# Patient Record
Sex: Female | Born: 1965 | Race: White | Hispanic: No | Marital: Married | Smoking: Never smoker
Health system: Southern US, Community
[De-identification: ages and names within clinical notes are randomized; demographics above are authoritative.]

## PROBLEM LIST (undated history)

## (undated) DIAGNOSIS — E785 Hyperlipidemia, unspecified: Secondary | ICD-10-CM

## (undated) DIAGNOSIS — I10 Essential (primary) hypertension: Secondary | ICD-10-CM

---

## 2003-10-14 ENCOUNTER — Observation Stay (HOSPITAL_COMMUNITY): Admission: EM | Admit: 2003-10-14 | Discharge: 2003-10-15 | Payer: Self-pay | Admitting: Emergency Medicine

## 2009-08-07 ENCOUNTER — Emergency Department (HOSPITAL_COMMUNITY): Admission: EM | Admit: 2009-08-07 | Discharge: 2009-08-08 | Payer: Self-pay | Admitting: Emergency Medicine

## 2010-05-01 LAB — POCT I-STAT, CHEM 8
Creatinine, Ser: 0.8 mg/dL (ref 0.4–1.2)
Sodium: 136 mEq/L (ref 135–145)

## 2010-07-01 NOTE — Discharge Summary (Signed)
NAME:  Monique Travis, Monique Travis                        ACCOUNT NO.:  [id number]   MEDICAL RECORD NO.:  [id number]                   PATIENT TYPE:  INP   LOCATION:  A222                                 FACILITY:  [facility]   PHYSICIAN:  Vania Rea, M.D.              DATE OF BIRTH:  02/14/1965   DATE OF ADMISSION:  10/14/2003  DATE OF DISCHARGE:  10/15/2003                                 DISCHARGE SUMMARY   PRIMARY CARE PHYSICIAN:  Unassigned.   DISCHARGE DIAGNOSES:  1.  Newly discovered hypertension.  2.  Headache, resolved.   DISPOSITION:  Discharged to home.   CONDITION ON DISCHARGE:  Stable.   DISCHARGE MEDICATIONS:  1.  HCTZ 12.5 mg daily.  2.  Lopressor 50 mg twice daily.  3.  Lisinopril 40 mg daily.   HOSPITAL COURSE:  Please refer to yesterday's admission History and  Physical.  This is a 45 year old Caucasian lady with no regular medical  followup who presented with three months of progressive fatigue and three  days of worsening headache.  The patient was found to have stage III  hypertension when evaluated. CT scan of the head was negative.  Chest x-ray  was negative.  Renal ultrasound was negative.  Urinalysis had no protein.  Her serum chemistry and CBC were essentially normal as described in the  admission History and Physical, and repeat labs today remain normal.  The  patient was started on a regimen of beta blockers, diuretic, and ACE  inhibitors, and this morning her pressure remained consistently normal.  For  the past 12 hours, systolics have ranged between 105 and 130, and her  diastolics remained between 68 and 80.   The patient's physical exam remains benign.  The patient had a consultation  with a nutritionist about an appropriate diet.  The patient has had a 2-D  echocardiogram, results of which are pending, and a fasting lipid panel has  been drawn, the results of which are pending.  The patient is trying to  secure a primary care physician.  When she  secures the physician, results of  her studies will be available.  In the meantime, the patient is being  discharged with a one-month supply of medication and advised to follow up  with a primary care physician of her choice within one week.     ___________________________________________                                         Vania Rea, M.D.   LC/MEDQ  D:  10/15/2003  T:  10/15/2003  Job:  [id number]

## 2010-07-01 NOTE — Procedures (Signed)
NAME:  Monique Travis, Monique Travis                        ACCOUNT NO.:  [id number]   MEDICAL RECORD NO.:  [id number]                   PATIENT TYPE:  INP   LOCATION:  A222                                 FACILITY:  [facility]   PHYSICIAN:  Vida Roller, M.D.                DATE OF BIRTH:  01-17-1966   DATE OF PROCEDURE:  DATE OF DISCHARGE:  10/15/2003                                  ECHOCARDIOGRAM   PRIMARY CARE PHYSICIAN:  Dr. Orvan Falconer.   TAPE NUMBER:  [id number].   TAPE COUNT:  [id number] through 3755.   INDICATIONS FOR PROCEDURE:  A 45 year old woman with hypertension.  The  technical quality of this study is poor.   MO tracings:  The aorta is 29 mm.   Left atrium is 35 mm.   Septum is 11 mm.   Posterior wall is 11 mm.   Left ventricular diastolic dimension is 48 mm.   Left ventricular systolic dimension is 33 mm.   2-D and Doppler imaging:  The left ventricle is normal size with normal  systolic function.  There is mild concentric left ventricular hypertrophy.  There are no obvious wall motion abnormalities.  Ejection fraction estimated  at 55 to 60%.   Right ventricle is normal size and normal systolic function.   Both atria appear to be normal size.   The aortic valve is not well seen, but there is no significant stenosis or  regurgitation.   The mitral valve is not well seen.  There appeared to be trace to mild  regurgitation.   The tricuspid valve has mild regurgitation.   Inferior vena cava appears to be normal size.   There is no pericardial effusion.   Ascending aorta not well seen.      ___________________________________________                                            Vida Roller, M.D.   JH/MEDQ  D:  10/15/2003  T:  10/16/2003  Job:  [id number]

## 2010-07-01 NOTE — H&P (Signed)
NAME:  Monique Travis, Monique Travis                        ACCOUNT NO.:  [id number]   MEDICAL RECORD NO.:  [id number]                   PATIENT TYPE:  EMS   LOCATION:  ED                                   FACILITY:  [facility]   PHYSICIAN:  Vania Rea, M.D.              DATE OF BIRTH:  [birth year]   DATE OF ADMISSION:  10/14/2003  DATE OF DISCHARGE:                                HISTORY & PHYSICAL   PRIMARY CARE PHYSICIAN:  Unassigned.   CHIEF COMPLAINT:  Headaches worse for the past 2 days.   HISTORY OF PRESENT ILLNESS:  This is a 45 year old Caucasian lady, who moved  from [place] about 2 years ago but has had no medical follow up for the  past 3 years.  Physician in [place] used to be Dr. Carita Pian.  For  the past 3 months, the patient has been feeling fatigued, not quite herself,  having headaches on and off and episodic dizziness.  For the past 2 days,  she has been having severe headaches associated with dizziness.  She came to  the emergency room where her blood pressure was found to be 185/113.   The patient denies any chest pain, shortness of breath, or palpitations.  She denies any orthopnea or PND.  She denies any lower extremity edema.  She  was nauseous yesterday but has been having no vomiting.   Her last menstrual period was last week and was normal.  She uses  contraceptive for intercourse.  She has not had a tubal ligation.  She has  been having no urinary frequency or decreased production of urine.   PAST MEDICAL HISTORY:  No significant.   PAST SURGICAL HISTORY:  Arthroscopic surgery of the right knee in 1984,  status post tonsillectomy as a child.   MEDICATIONS:  None.   ALLERGIES:  None.   SOCIAL HISTORY:  She has been married for the past 15 years.  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  She denies tobacco, alcohol, or  illicit drug use.   FAMILY HISTORY:  Significant for a mother who died at age 18 as a result of  complications of a hip  replacement surgery, apparently developed lower  extremity DVT and subsequent embolus, coded and then during the code,  endotracheal tube was placed in the esophagus.  No history of hypertension  in the mother.  Her father died at age 60 from cirrhosis of the liver.  He  was a heavy drinker.  She has one brother, age 61, in good health.  She is  the only member of her family who has now been found in hypertension.  She  does not have a past history of hypertension.   REVIEW OF SYSTEMS:  Headache as noted above.  She denies any syncope.  She  denies sinusitis, throat, or thyroid problems.  She denies chest pains or  shortness  of breath.  She denies any cardiac symptoms.  She denies vomiting,  diarrhea, or constipation.  She denies any urinary symptoms.  She denies any  problems related to her joints.  She denies anxiety or depression.  She  denies insomnia.  She denies any focal weakness.   PHYSICAL EXAMINATION:  GENERAL:  This is a pleasant Caucasian lady lying  flat in bed in no distress at this time.  VITAL SIGNS:  Initial temperature is 99.5.  Her current blood pressures are  160/100.  She has had 20 mg of Labetalol intravenously.  HEENT:  Her pupils are round, equal, and reactive to light.  She has pink  and anicteric.  Mucous membranes are moist.  She has all her teeth.  NECK:  There is no lymphadenopathy and no jugular venous distension.  CHEST:  Clear to auscultation bilaterally.  CARDIOVASCULAR:  She has a regular rhythm.  She has a 1-2/6 ejection  systolic murmur at the left upper sternal border.  ABDOMEN:  Obese, soft, and nontender.  There is no organomegaly.  EXTREMITIES:  She has 2+ pulses bilaterally, and she has a trace of edema  bilaterally.  NEUROLOGIC:  Her cranial nerves are grossly intact, and she has no focal  neurologic deficit.   LABORATORY DATA:  Her CBC is essentially normal with a white count of 8.8  and hematocrit of 39.9; however, MCV is low at 75.6.  Her  platelets are 343.  She seems to be of [place] ancestry and could possibly have some type of  thalassemia.  She has a normal differential.  Her serum chemistry is  essentially normal with a sodium of 140 and potassium of 3.6.  CO2 is 29,  and glucose is 88.  BUN is 8, creatinine 0.8, and calcium is 9.4.  CT scan  of the head shows no acute abnormality.   ASSESSMENT:  1.  Hypertensive urgency.  2.  Newly discovered hypertension with normal labs.  At this time, we have      no indication that she may have a secondary cause such as      pheochromocytoma or kidney disease; however, we will complete her labs      with liver function panel, and we will do an ultrasound to assess the      state of her kidneys.  We will also check her urine for proteins.      Although she denies illicit drug use, we will do a urine drug screen.      For the time being, we feel she can be managed in concentrated care, and      we will start with three drugs, Lopressor, a diuretic, and an ACE      inhibitor.  We will get EKG and echocardiogram to assess her cardiac      size, and that will give [place] an idea of the duration of this      hypertension.     ___________________________________________                                         Vania Rea, M.D.   LC/MEDQ  D:  10/14/2003  T:  10/14/2003  Job:  [id number]

## 2012-01-01 ENCOUNTER — Emergency Department (INDEPENDENT_AMBULATORY_CARE_PROVIDER_SITE_OTHER): Payer: BC Managed Care – PPO

## 2012-01-01 ENCOUNTER — Encounter (HOSPITAL_COMMUNITY): Payer: Self-pay | Admitting: *Deleted

## 2012-01-01 ENCOUNTER — Emergency Department (INDEPENDENT_AMBULATORY_CARE_PROVIDER_SITE_OTHER)
Admission: EM | Admit: 2012-01-01 | Discharge: 2012-01-01 | Disposition: A | Discharge status: Home / Self Care | Payer: BC Managed Care – PPO | Source: Home / Self Care

## 2012-01-01 DIAGNOSIS — S63509A Unspecified sprain of unspecified wrist, initial encounter: Secondary | ICD-10-CM

## 2012-01-01 DIAGNOSIS — S66919A Strain of unspecified muscle, fascia and tendon at wrist and hand level, unspecified hand, initial encounter: Secondary | ICD-10-CM

## 2012-01-01 HISTORY — DX: Hyperlipidemia, unspecified: E78.5

## 2012-01-01 HISTORY — DX: Essential (primary) hypertension: I10

## 2012-01-01 MED ORDER — HYDROCODONE-ACETAMINOPHEN 5-325 MG PO TABS: 1.0000 | ORAL_TABLET | ORAL | Status: AC | PRN

## 2012-01-01 NOTE — ED Provider Notes (Signed)
History     CSN: [id number]  Arrival date & time 01/01/12  1924   None     Chief Complaint  Patient presents with  . Arm Injury    (Consider location/radiation/quality/duration/timing/severity/associated sxs/prior treatment) HPI Comments: This patient was playing tennis and she was holding a racket loosely in her right hand. When she swung a racket she injured her right wrist. She is uncertain as the mechanism of injury but states she felt a pop and a rather severe acute pain along the ulnar aspect of her wrist. She did not fall on the wrist and she does not believe she had direct blunt trauma.  Patient is a 46 y.o. female presenting with arm injury.  Arm Injury     Past Medical History  Diagnosis Date  . Hyperlipidemia   . Hypertension     History reviewed. No pertinent past surgical history.  No family history on file.  History  Substance Use Topics  . Smoking status: Never Smoker   . Smokeless tobacco: Not on file  . Alcohol Use: No    OB History    Grav Para Term Preterm Abortions TAB SAB Ect Mult Living                  Review of Systems  All other systems reviewed and are negative.    Allergies  Review of patient's allergies indicates no known allergies.  Home Medications   Current Outpatient Rx  Name  Route  Sig  Dispense  Refill  . GEMFIBROZIL 600 MG PO TABS   Oral   Take 600 mg by mouth 2 (two) times daily before a meal.         . LISINOPRIL-HYDROCHLOROTHIAZIDE PO   Oral   Take by mouth 2 (two) times daily.         Marland Kitchen METOPROLOL TARTRATE 100 MG PO TABS   Oral   Take 100 mg by mouth 2 (two) times daily.           BP 127/80  Pulse 66  Temp 98.4 F (36.9 C) (Oral)  Resp 16  SpO2 100%  LMP 12/31/2011  Physical Exam  Constitutional: She is oriented to person, place, and time. She appears well-developed and well-nourished. No distress.  Eyes: Conjunctivae normal and EOM are normal.  Neck: Normal range of motion. Neck supple.    Pulmonary/Chest: Effort normal.  Musculoskeletal:       Marked tenderness along the ulnar aspect of the right wrist. She is able to flex extend slowly with minimal pain and ulnar and radial deviation is somewhat limited due to pain. Distal neurovascular motor sensory is intact. There is no deformity. Swelling is minor.  Neurological: She is alert and oriented to person, place, and time. She exhibits normal muscle tone. Coordination normal.  Skin: Skin is warm and dry. No rash noted. No erythema.  Psychiatric: She has a normal mood and affect.    ED Course  Procedures (including critical care time)  Labs Reviewed - No data to display Dg Wrist Complete Right  01/01/2012  *RADIOLOGY REPORT*  Clinical Data: Hyperextended right wrist playing tennis, pain at ulnar aspect  RIGHT WRIST - COMPLETE 3+ VIEW  Comparison: None.  Findings: Bone mineralization normal. Joint spaces preserved. No fracture, dislocation, or bone destruction.  IMPRESSION: Normal exam.   Original Report Authenticated By: Ulyses Southward, M.D.      1. Wrist strain       MDM  Wrist splint for 7-10 days.  Ice to the wrist for the next 2 hours Elevate Limited use for the next 2 weeks.        Hayden Rasmussen, NP 01/01/12 2038

## 2012-01-01 NOTE — ED Notes (Signed)
States was playing tennis @ approx 1700 today, "I wasn't holding the racket tight enough" and right wrist got bent back.  C/O painful right wrist with difficulty with any ROM.

## 2012-01-02 NOTE — ED Provider Notes (Signed)
Medical screening examination/treatment/procedure(s) were performed by resident physician or non-physician practitioner and as supervising physician I was immediately available for consultation/collaboration.   Barkley Bruns MD.    Linna Hoff, MD 01/02/12 1044

## 2014-06-01 IMAGING — CR DG WRIST COMPLETE 3+V*R*
2 series · 2 of 2 positions shown · non-contrast
Comparison: None.

CLINICAL DATA: Hyperextended right wrist playing tennis, pain at
ulnar aspect

RIGHT WRIST - COMPLETE 3+ VIEW

[view not recorded (1 of 2)]
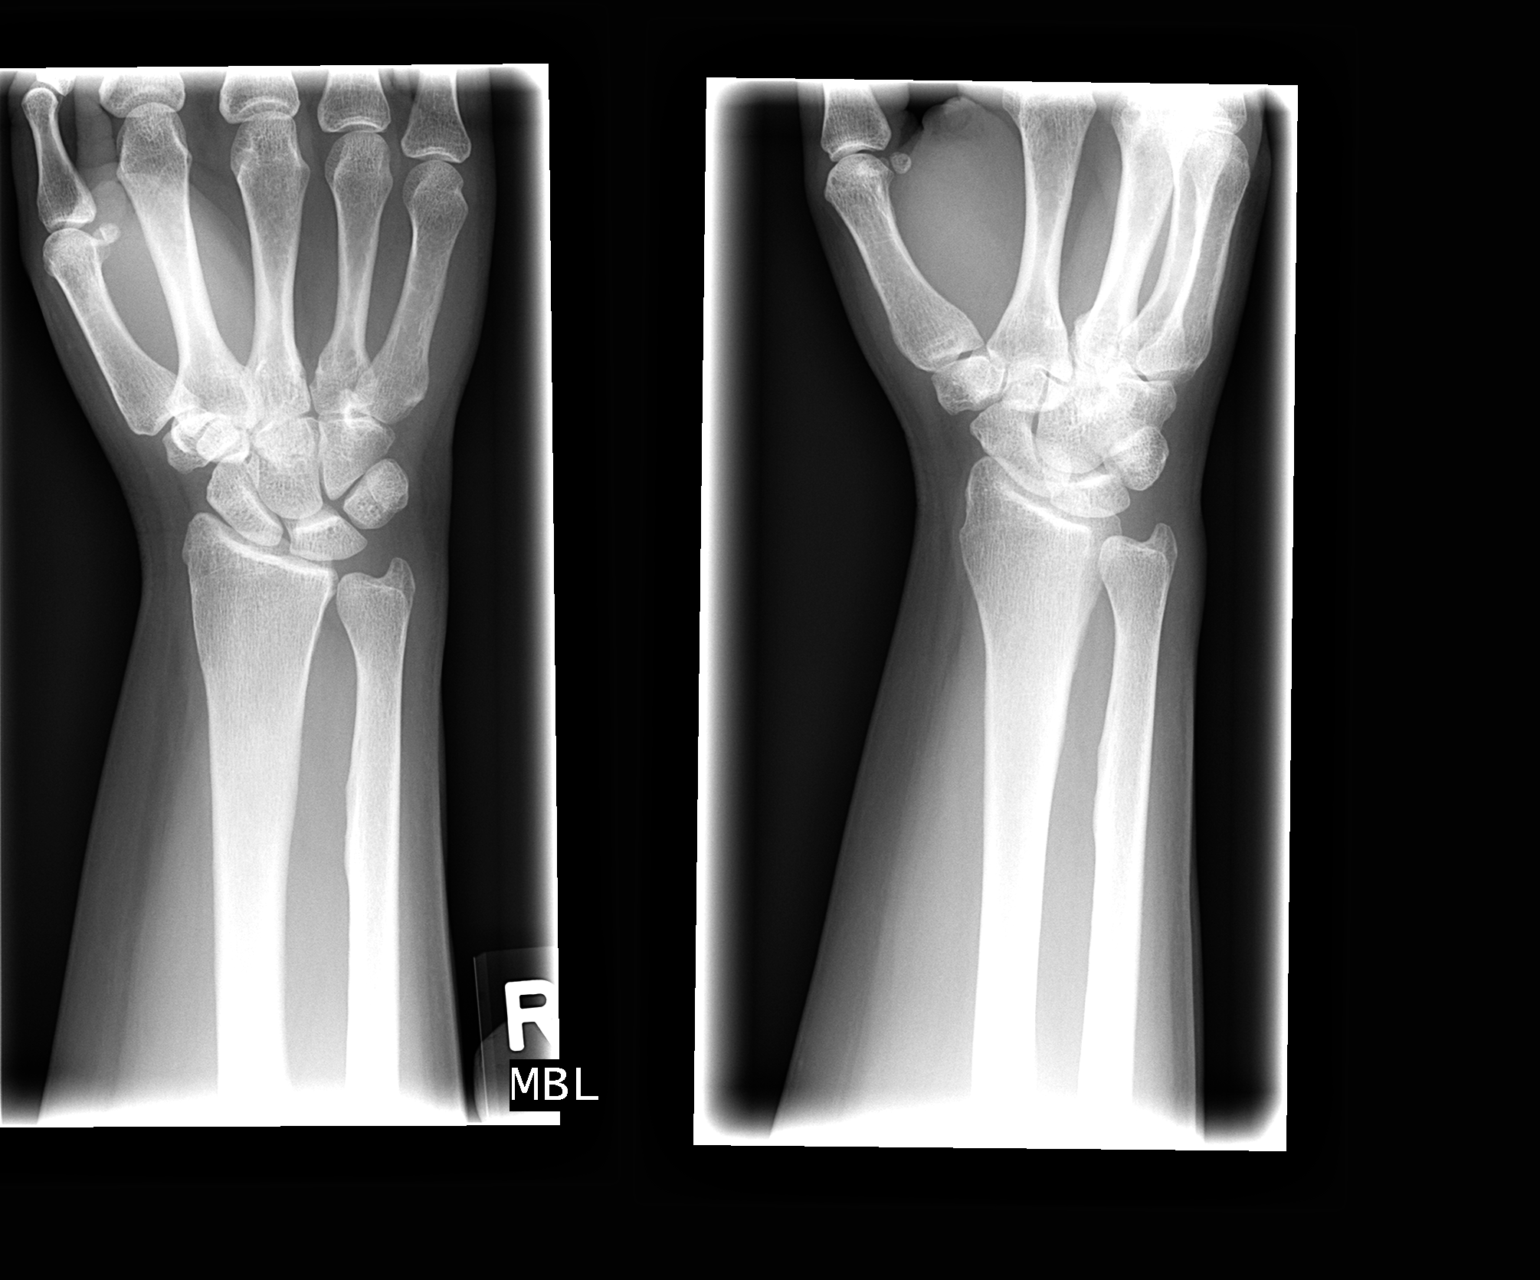

[view not recorded (2 of 2)]
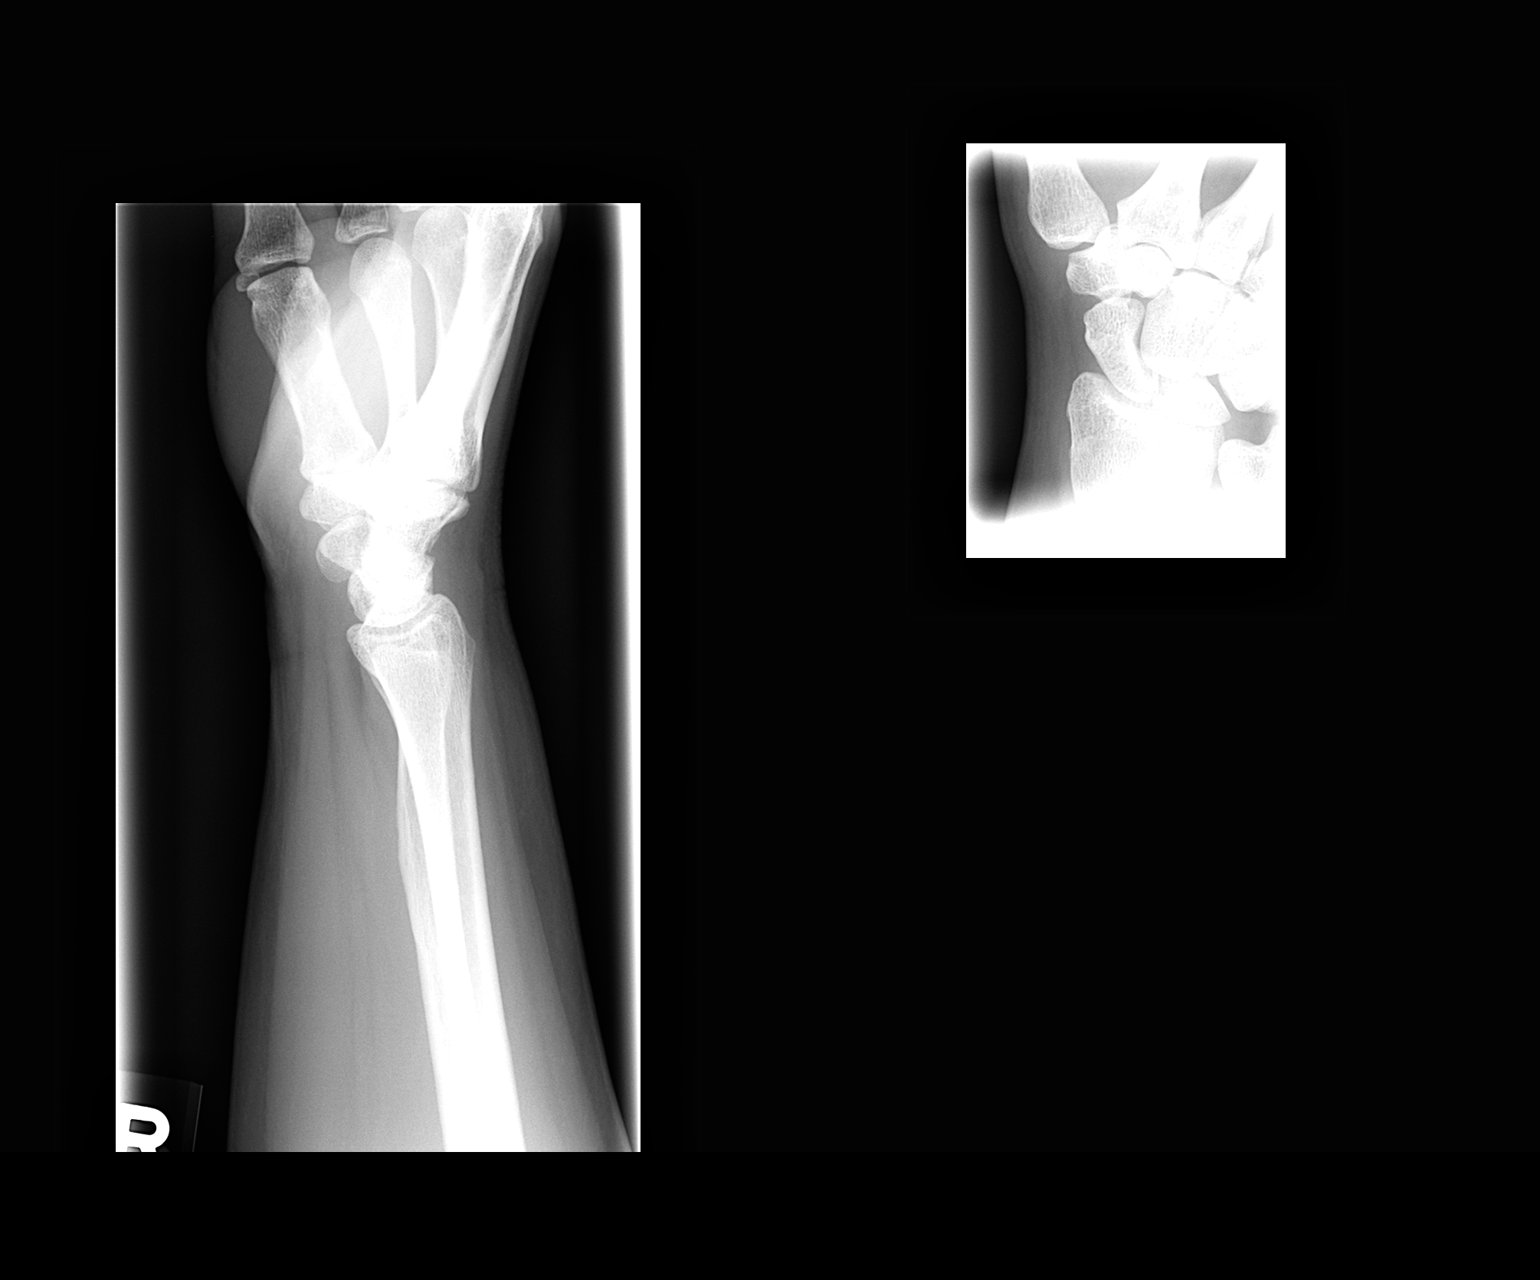

[2 of 2 positions shown; findings below may reference images not displayed]

FINDINGS: Bone mineralization normal.
Joint spaces preserved.
No fracture, dislocation, or bone destruction.
IMPRESSION: Normal exam.

## 2015-01-26 ENCOUNTER — Encounter: Payer: Self-pay | Admitting: Women's Health

## 2018-01-13 ENCOUNTER — Encounter: Payer: Self-pay | Admitting: Emergency Medicine

## 2018-01-13 ENCOUNTER — Emergency Department (HOSPITAL_COMMUNITY)
Admission: EM | Admit: 2018-01-13 | Discharge: 2018-01-13 | Disposition: A | Discharge status: Home / Self Care | Payer: BC Managed Care – PPO | Attending: Emergency Medicine | Admitting: Emergency Medicine

## 2018-01-13 DIAGNOSIS — Z79899 Other long term (current) drug therapy: Secondary | ICD-10-CM | POA: Diagnosis not present

## 2018-01-13 DIAGNOSIS — R519 Headache, unspecified: Secondary | ICD-10-CM

## 2018-01-13 DIAGNOSIS — R51 Headache: Secondary | ICD-10-CM | POA: Insufficient documentation

## 2018-01-13 DIAGNOSIS — I10 Essential (primary) hypertension: Secondary | ICD-10-CM | POA: Insufficient documentation

## 2018-01-13 LAB — BASIC METABOLIC PANEL
Anion gap: 8 (ref 5–15)
BUN: 10 mg/dL (ref 6–20)
CALCIUM: 9.6 mg/dL (ref 8.9–10.3)
CO2: 28 mmol/L (ref 22–32)
CREATININE: 0.73 mg/dL (ref 0.44–1.00)
Chloride: 101 mmol/L (ref 98–111)
GFR calc non Af Amer: >60 mL/min (ref >60)
Glucose, Bld: 110 mg/dL — ABNORMAL HIGH (ref 70–99)
Potassium: 3.2 mmol/L — ABNORMAL LOW (ref 3.5–5.1)
Sodium: 137 mmol/L (ref 135–145)

## 2018-01-13 LAB — CBC WITH DIFFERENTIAL/PLATELET
ABS IMMATURE GRANULOCYTES: 0.04 10*3/uL (ref 0.00–0.07)
Basophils Absolute: 0.1 10*3/uL (ref 0.0–0.1)
Basophils Relative: 1 %
EOS ABS: 0.2 10*3/uL (ref 0.0–0.5)
Eosinophils Relative: 2 %
HEMATOCRIT: 47.2 % — AB (ref 36.0–46.0)
Hemoglobin: 15.8 g/dL — ABNORMAL HIGH (ref 12.0–15.0)
Immature Granulocytes: 1 %
Lymphocytes Relative: 20 %
Lymphs Abs: 1.6 10*3/uL (ref 0.7–4.0)
MCH: 27.6 pg (ref 26.0–34.0)
MCHC: 33.5 g/dL (ref 30.0–36.0)
MCV: 82.5 fL (ref 80.0–100.0)
MONO ABS: 0.5 10*3/uL (ref 0.1–1.0)
MONOS PCT: 7 %
NEUTROS ABS: 5.6 10*3/uL (ref 1.7–7.7)
Neutrophils Relative %: 69 %
Platelets: 314 10*3/uL (ref 150–400)
RBC: 5.72 MIL/uL — ABNORMAL HIGH (ref 3.87–5.11)
RDW: 12.9 % (ref 11.5–15.5)
WBC: 8.1 10*3/uL (ref 4.0–10.5)
nRBC: 0 % (ref 0.0–0.2)

## 2018-01-13 MED ORDER — METOCLOPRAMIDE HCL 10 MG PO TABS
10.0000 mg | ORAL_TABLET | Freq: Once | ORAL | Status: AC
Start: 2018-01-13 — End: 2018-01-13
  Administered 2018-01-13: 10 mg via ORAL
  Filled 2018-01-13: qty 1

## 2018-01-13 MED ORDER — CLONIDINE HCL 0.2 MG PO TABS
0.2000 mg | ORAL_TABLET | Freq: Once | ORAL | Status: AC
  Administered 2018-01-13: 0.2 mg via ORAL
  Filled 2018-01-13: qty 1

## 2018-01-13 MED ORDER — KETOROLAC TROMETHAMINE 60 MG/2ML IM SOLN
60.0000 mg | Freq: Once | INTRAMUSCULAR | Status: AC
  Administered 2018-01-13: 60 mg via INTRAMUSCULAR
  Filled 2018-01-13: qty 2

## 2018-01-13 NOTE — ED Triage Notes (Signed)
Pt with high bp at home 170's/110's.  Pt takes Lisinopril and HTCZ at home.

## 2018-01-13 NOTE — ED Provider Notes (Signed)
[facility] Provider [facility]   CSN: [id number] Arrival date & time: 01/13/18  1502     History   Chief Complaint Chief Complaint  Patient presents with  . Hypertension    HPI Monique Travis is a 52 y.o. female.  HPI She states she woke yesterday with bilateral headache, dizziness and nausea.  States she took her blood pressure and it was elevated.  She states she is been compliant with her medications at home.  Has ongoing headache but nausea and has have resolved.  Denies any focal weakness or numbness.  Denies caffeine or stimulant use.  States she has been under a lot of stress at work. Past Medical History:  Diagnosis Date  . Hyperlipidemia   . Hypertension     There are no active problems to display for this patient.   History reviewed. No pertinent surgical history.   OB History   None      Home Medications    Prior to Admission medications   Medication Sig Start Date End Date Taking? Authorizing Provider  lisinopril-hydrochlorothiazide (PRINZIDE,ZESTORETIC) 20-12.5 MG tablet Take 2 tablets by mouth daily.   Yes [provider]  gemfibrozil (LOPID) 600 MG tablet Take 600 mg by mouth 2 (two) times daily before a meal.    [provider]  HYDROcodone-acetaminophen (NORCO/VICODIN) 5-325 MG per tablet Take 1 tablet by mouth every 4 (four) hours as needed for pain. 01/01/12   Hayden RasmussenMabe, Cheo Selvey, NP  LISINOPRIL-HYDROCHLOROTHIAZIDE PO Take by mouth 2 (two) times daily.    [provider]  metoprolol (LOPRESSOR) 100 MG tablet Take 100 mg by mouth 2 (two) times daily.    [provider]    Family History History reviewed. No pertinent family history.  Social History Social History   Tobacco Use  . Smoking status: Never Smoker  . Smokeless tobacco: Never Used  Substance Use Topics  . Alcohol use: No  . Drug use: No     Allergies   Patient has no known allergies.   Review of Systems Review of Systems    Constitutional: Negative for chills and fever.  HENT: Negative for congestion and trouble swallowing.   Eyes: Negative for photophobia and visual disturbance.  Respiratory: Negative for cough and shortness of breath.   Cardiovascular: Negative for chest pain, palpitations and leg swelling.  Gastrointestinal: Positive for nausea and vomiting. Negative for abdominal pain, constipation and diarrhea.  Genitourinary: Negative for dysuria, flank pain and frequency.  Musculoskeletal: Negative for back pain, gait problem, myalgias and neck pain.  Skin: Negative for rash and wound.  Neurological: Positive for dizziness and headaches. Negative for syncope, speech difficulty, weakness, light-headedness and numbness.  All other systems reviewed and are negative.    Physical Exam Updated Vital Signs BP 124/75   Pulse 87   Temp 98.1 F (36.7 C) (Temporal)   Resp 12   Ht 5\' 4"  (1.626 m)   Wt 79.4 kg   LMP  (LMP Unknown) Comment: IUD  SpO2 98%   BMI 30.04 kg/m   Physical Exam  Constitutional: She is oriented to person, place, and time. She appears well-developed and well-nourished. No distress.  HENT:  Head: Normocephalic and atraumatic.  Mouth/Throat: Oropharynx is clear and moist. No oropharyngeal exudate.  Eyes: Pupils are equal, round, and reactive to light. EOM are normal.  No nystagmus  Neck: Normal range of motion. Neck supple. No JVD present.  Cardiovascular: Normal rate and regular rhythm. Exam reveals no gallop and no  friction rub.  No murmur heard. Pulmonary/Chest: Effort normal and breath sounds normal. No stridor. No respiratory distress. She has no wheezes. She has no rales. She exhibits no tenderness.  Abdominal: Soft. Bowel sounds are normal. There is no tenderness. There is no rebound and no guarding.  Musculoskeletal: Normal range of motion. She exhibits no edema or tenderness.  No lower extremity swelling, asymmetry or tenderness.  Distal pulses intact.   Lymphadenopathy:    She has no cervical adenopathy.  Neurological: She is alert and oriented to person, place, and time.  Patient is alert and oriented x3 with clear, goal oriented speech. Patient has 5/5 motor in all extremities. Sensation is intact to light touch. Bilateral finger-to-nose is normal with no signs of dysmetria.   Skin: Skin is warm and dry. Capillary refill takes less than 2 seconds. No rash noted. She is not diaphoretic. No erythema.  Psychiatric: She has a normal mood and affect. Her behavior is normal.  Nursing note and vitals reviewed.    ED Treatments / Results  Labs (all labs ordered are listed, but only abnormal results are displayed) Labs Reviewed  CBC WITH DIFFERENTIAL/PLATELET - Abnormal; Notable for the following components:      Result Value   RBC 5.72 (*)    Hemoglobin 15.8 (*)    HCT 47.2 (*)    All other components within normal limits  BASIC METABOLIC PANEL - Abnormal; Notable for the following components:   Potassium 3.2 (*)    Glucose, Bld 110 (*)    All other components within normal limits    EKG None  Radiology No results found.  Procedures Procedures (including critical care time)  Medications Ordered in ED Medications  ketorolac (TORADOL) injection 60 mg (60 mg Intramuscular Given 01/13/18 1841)  metoCLOPramide (REGLAN) tablet 10 mg (10 mg Oral Given 01/13/18 1839)  cloNIDine (CATAPRES) tablet 0.2 mg (0.2 mg Oral Given 01/13/18 1839)     Initial Impression / Assessment and Plan / ED Course  I have reviewed the triage vital signs and the nursing notes.  Pertinent labs & imaging results that were available during my care of the patient were reviewed by me and considered in my medical decision making (see chart for details).    Blood pressure is significantly improved.  Headache is completely resolved.  We will follow-up closely with her primary physician tomorrow.  Strict return precautions given.   Final Clinical Impressions(s)  / ED Diagnoses   Final diagnoses:  Hypertension, unspecified type  Acute nonintractable headache, unspecified headache type    ED Discharge Orders    None       Loren Racer, MD 01/13/18 2038

## 2018-09-16 ENCOUNTER — Other Ambulatory Visit: Payer: Self-pay

## 2018-09-16 ENCOUNTER — Other Ambulatory Visit: Payer: BC Managed Care – PPO

## 2018-09-16 DIAGNOSIS — Z20822 Contact with and (suspected) exposure to covid-19: Secondary | ICD-10-CM

## 2018-09-17 LAB — NOVEL CORONAVIRUS, NAA: SARS-CoV-2, NAA: NOT DETECTED

## 2018-12-06 ENCOUNTER — Other Ambulatory Visit: Payer: Self-pay

## 2018-12-06 DIAGNOSIS — Z20822 Contact with and (suspected) exposure to covid-19: Secondary | ICD-10-CM

## 2018-12-08 LAB — NOVEL CORONAVIRUS, NAA: SARS-CoV-2, NAA: NOT DETECTED

## 2019-01-21 ENCOUNTER — Other Ambulatory Visit: Payer: Self-pay | Admitting: *Deleted

## 2019-01-21 DIAGNOSIS — Z20822 Contact with and (suspected) exposure to covid-19: Secondary | ICD-10-CM

## 2019-01-24 LAB — NOVEL CORONAVIRUS, NAA: SARS-CoV-2, NAA: NOT DETECTED

## 2022-12-16 ENCOUNTER — Ambulatory Visit
Admission: EM | Admit: 2022-12-16 | Discharge: 2022-12-16 | Disposition: A | Discharge status: Home / Self Care | Payer: Medicaid Other | Attending: Family Medicine | Admitting: Family Medicine

## 2022-12-16 DIAGNOSIS — Z87440 Personal history of urinary (tract) infections: Secondary | ICD-10-CM | POA: Insufficient documentation

## 2022-12-16 DIAGNOSIS — R109 Unspecified abdominal pain: Secondary | ICD-10-CM | POA: Diagnosis present

## 2022-12-16 DIAGNOSIS — R8290 Unspecified abnormal findings in urine: Secondary | ICD-10-CM | POA: Diagnosis not present

## 2022-12-16 DIAGNOSIS — I10 Essential (primary) hypertension: Secondary | ICD-10-CM | POA: Diagnosis not present

## 2022-12-16 LAB — POCT URINALYSIS DIP (MANUAL ENTRY)
Bilirubin, UA: NEGATIVE
Glucose, UA: NEGATIVE mg/dL
Ketones, POC UA: NEGATIVE mg/dL
Nitrite, UA: NEGATIVE
Protein Ur, POC: NEGATIVE mg/dL
Spec Grav, UA: 1.015 (ref 1.010–1.025)
Urobilinogen, UA: 0.2 U/dL
pH, UA: 7 (ref 5.0–8.0)

## 2022-12-16 MED ORDER — CEPHALEXIN 500 MG PO CAPS: 500.0000 mg | ORAL_CAPSULE | Freq: Two times a day (BID) | ORAL | 0 refills | Status: AC

## 2022-12-16 NOTE — ED Triage Notes (Signed)
Pt reports she has low back pain x x 2 weeks    Wants to rule out kidney stone or uti

## 2022-12-16 NOTE — ED Provider Notes (Signed)
[facility]    CSN: [id number] Arrival date & time: 12/16/22  1150      History   Chief Complaint Chief Complaint  Patient presents with   Back Pain    HPI Monique Travis is a 57 y.o. female.   Patient presenting today with 2-week history of right flank pain.  She states the pain is dull, constant and does not seem to be made better or worse by anything.  Denies associated fever, chills, nausea, vomiting, bowel changes, urinary symptoms, vaginal symptoms.  So far not tried anything over-the-counter for symptoms.  Wanting to make sure she does not have a UTI or kidney stone, had a UTI about a month ago and states she thought symptoms resolved after medication.    Past Medical History:  Diagnosis Date   Hyperlipidemia    Hypertension     There are no problems to display for this patient.   History reviewed. No pertinent surgical history.  OB History   No obstetric history on file.      Home Medications    Prior to Admission medications   Medication Sig Start Date End Date Taking? Authorizing Provider  cephALEXin (KEFLEX) 500 MG capsule Take 1 capsule (500 mg total) by mouth 2 (two) times daily. 12/16/22  Yes Particia Nearing, PA-C  amLODipine (NORVASC) 5 MG tablet Take 5 mg by mouth daily.    [provider]  gemfibrozil (LOPID) 600 MG tablet Take 600 mg by mouth 2 (two) times daily before a meal.    [provider]  HYDROcodone-acetaminophen (NORCO/VICODIN) 5-325 MG per tablet Take 1 tablet by mouth every 4 (four) hours as needed for pain. 01/01/12   Hayden Rasmussen, NP  lisinopril-hydrochlorothiazide (PRINZIDE,ZESTORETIC) 20-12.5 MG tablet Take 2 tablets by mouth daily.    [provider]  LISINOPRIL-HYDROCHLOROTHIAZIDE PO Take by mouth 2 (two) times daily.    [provider]  metoprolol (LOPRESSOR) 100 MG tablet Take 100 mg by mouth 2 (two) times daily.    [provider]    Family History History  reviewed. No pertinent family history.  Social History Social History   Tobacco Use   Smoking status: Never   Smokeless tobacco: Never  Substance Use Topics   Alcohol use: No   Drug use: No     Allergies   Patient has no known allergies.   Review of Systems Review of Systems PER HPI  Physical Exam Triage Vital Signs ED Triage Vitals  Encounter Vitals Group     BP 12/16/22 1244 (!) 148/80     Systolic BP Percentile --      Diastolic BP Percentile --      Pulse Rate 12/16/22 1244 67     Resp 12/16/22 1244 20     Temp 12/16/22 1244 98 F (36.7 C)     Temp Source 12/16/22 1244 Oral     SpO2 12/16/22 1244 97 %     Weight --      Height --      Head Circumference --      Peak Flow --      Pain Score 12/16/22 1241 5     Pain Loc --      Pain Education --      Exclude from Growth Chart --    No data found.  Updated Vital Signs BP (!) 148/80 (BP Location: Right Arm)   Pulse 67   Temp 98 F (36.7 C) (Oral)   Resp 20  LMP  (LMP Unknown) Comment: IUD  SpO2 97%   Visual Acuity Right Eye Distance:   Left Eye Distance:   Bilateral Distance:    Right Eye Near:   Left Eye Near:    Bilateral Near:     Physical Exam Vitals and nursing note reviewed.  Constitutional:      Appearance: Normal appearance. She is not ill-appearing.  HENT:     Head: Atraumatic.  Eyes:     Extraocular Movements: Extraocular movements intact.     Conjunctiva/sclera: Conjunctivae normal.  Cardiovascular:     Rate and Rhythm: Normal rate and regular rhythm.     Heart sounds: Normal heart sounds.  Pulmonary:     Effort: Pulmonary effort is normal.     Breath sounds: Normal breath sounds.  Abdominal:     General: Bowel sounds are normal. There is no distension.     Palpations: Abdomen is soft.     Tenderness: There is no abdominal tenderness. There is no right CVA tenderness, left CVA tenderness or guarding.  Musculoskeletal:        General: No swelling, tenderness or signs of  injury. Normal range of motion.     Cervical back: Normal range of motion and neck supple.  Skin:    General: Skin is warm and dry.  Neurological:     Mental Status: She is alert and oriented to person, place, and time.  Psychiatric:        Mood and Affect: Mood normal.        Thought Content: Thought content normal.        Judgment: Judgment normal.      UC Treatments / Results  Labs (all labs ordered are listed, but only abnormal results are displayed) Labs Reviewed  POCT URINALYSIS DIP (MANUAL ENTRY) - Abnormal; Notable for the following components:      Result Value   Clarity, UA cloudy (*)    Blood, UA trace-intact (*)    Leukocytes, UA Small (1+) (*)    All other components within normal limits  URINE CULTURE    EKG   Radiology No results found.  Procedures Procedures (including critical care time)  Medications Ordered in UC Medications - No data to display  Initial Impression / Assessment and Plan / UC Course  I have reviewed the triage vital signs and the nursing notes.  Pertinent labs & imaging results that were available during my care of the patient were reviewed by me and considered in my medical decision making (see chart for details).     Mildly hypertensive in triage, otherwise vital signs within normal limits.  She is well-appearing and in no acute distress.  Urinalysis today with possible evidence of a urinary tract infection, will treat with Keflex while waiting urine culture for further evaluation and adjust if needed.  Not consistent with muscular back pain as not reproducible on palpation or movement.  Discussed heat, over-the-counter pain relievers and follow-up with PCP for recheck next week.  Return sooner for worsening symptoms.  Final Clinical Impressions(s) / UC Diagnoses   Final diagnoses:  Flank pain  Abnormal urinalysis     Discharge Instructions      I have sent over an antibiotic for a possible UTI but we wait for the culture  for more information.  Follow-up with your primary care provider first thing next week.  Drink plenty of fluids.  Return for worsening symptoms.    ED Prescriptions     Medication Sig Dispense  Auth. Provider   cephALEXin (KEFLEX) 500 MG capsule Take 1 capsule (500 mg total) by mouth 2 (two) times daily. 10 capsule Particia Nearing, [place]      PDMP not reviewed this encounter.   Particia Nearing, [place] 12/16/22 1519

## 2022-12-16 NOTE — Discharge Instructions (Signed)
I have sent over an antibiotic for a possible UTI but we wait for the culture for more information.  Follow-up with your primary care provider first thing next week.  Drink plenty of fluids.  Return for worsening symptoms.

## 2022-12-17 LAB — URINE CULTURE: Culture: <10000 — AB

## 2022-12-18 ENCOUNTER — Ambulatory Visit
Admission: RE | Admit: 2022-12-18 | Discharge: 2022-12-18 | Disposition: A | Discharge status: Home / Self Care | Payer: Medicaid Other | Source: Ambulatory Visit | Attending: Nurse Practitioner | Admitting: Nurse Practitioner

## 2022-12-18 VITALS — BP 137/81 | HR 78 | Temp 98.2°F | Resp 18

## 2022-12-18 DIAGNOSIS — R109 Unspecified abdominal pain: Secondary | ICD-10-CM | POA: Diagnosis present

## 2022-12-18 LAB — POCT URINALYSIS DIP (MANUAL ENTRY)
Bilirubin, UA: NEGATIVE
Glucose, UA: NEGATIVE mg/dL
Ketones, POC UA: NEGATIVE mg/dL
Nitrite, UA: NEGATIVE
Protein Ur, POC: NEGATIVE mg/dL
Spec Grav, UA: 1.01 (ref 1.010–1.025)
Urobilinogen, UA: 0.2 U/dL
pH, UA: 6 (ref 5.0–8.0)

## 2022-12-18 MED ORDER — TAMSULOSIN HCL 0.4 MG PO CAPS: 0.4000 mg | ORAL_CAPSULE | Freq: Every day | ORAL | 0 refills | Status: AC

## 2022-12-18 NOTE — Discharge Instructions (Addendum)
Urine culture is pending.  You will be contacted if the pending test result is abnormal. Take medication as prescribed. Continue to drink plenty of fluids.  Try to drink at least 10-12 8 ounce glasses of water daily. May take over-the-counter Tylenol or ibuprofen as needed for pain, fever, general discomfort. Strain your urine to determine whether or not you have passed a stone. As discussed, if you develop worsening pain, it is recommended that you go to the emergency department for further evaluation. Schedule appointment with PCP, if symptoms persist once you are scheduled with PCP, discussed referral to urology.. Follow-up as needed.

## 2022-12-18 NOTE — ED Provider Notes (Signed)
[facility]    CSN: [id number] Arrival date & time: 12/18/22  1254      History   Chief Complaint Chief Complaint  Patient presents with   Flank Pain    Entered by patient    HPI Monique Travis is a 57 y.o. female.   The history is provided by the patient.   Patient presents for complaints of continued right flank pain.  Patient was seen in this clinic on 12/16/2022, after a 2-week history of right flank pain.  Urinalysis indicated a possible UTI, patient was started on Keflex while urine culture was pending.  Urine culture was resulted, which showed insignificant growth.  Patient reports that she continues to experience right flank pain, feels that it is a little "worse" today.  She rates her pain 5/10 at present.  Patient states that the pain has not changed as far as the location, reports that the pain continues to be constant, describes the pain as "dullness" around the right flank.  Patient denies new symptoms of fever, chills, chest pain, abdominal pain, nausea, vomiting, diarrhea, urinary frequency, urgency, decreased urine stream, or hematuria.  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  She states that she does a lot of physical activity to include transferring the patient, and lifting.  Past Medical History:  Diagnosis Date   Hyperlipidemia    Hypertension     There are no problems to display for this patient.   History reviewed. No pertinent surgical history.  OB History   No obstetric history on file.      Home Medications    Prior to Admission medications   Medication Sig Start Date End Date Taking? Authorizing Provider  amLODipine (NORVASC) 5 MG tablet Take 5 mg by mouth daily.   Yes [provider]  cephALEXin (KEFLEX) 500 MG capsule Take 1 capsule (500 mg total) by mouth 2 (two) times daily. 12/16/22  Yes Particia Nearing, PA-C  LISINOPRIL-HYDROCHLOROTHIAZIDE PO Take by mouth 2 (two) times daily.   Yes [provider]  tamsulosin (FLOMAX) 0.4 MG CAPS capsule Take 1 capsule (0.4 mg total) by mouth daily after supper. 12/18/22 01/17/23 Yes Leath-Warren, Sadie Haber, NP  gemfibrozil (LOPID) 600 MG tablet Take 600 mg by mouth 2 (two) times daily before a meal.    [provider]  HYDROcodone-acetaminophen (NORCO/VICODIN) 5-325 MG per tablet Take 1 tablet by mouth every 4 (four) hours as needed for pain. 01/01/12   Hayden Rasmussen, NP  lisinopril-hydrochlorothiazide (PRINZIDE,ZESTORETIC) 20-12.5 MG tablet Take 2 tablets by mouth daily.    [provider]  metoprolol (LOPRESSOR) 100 MG tablet Take 100 mg by mouth 2 (two) times daily.    [provider]    Family History History reviewed. No pertinent family history.  Social History Social History   Tobacco Use   Smoking status: Never   Smokeless tobacco: Never  Substance Use Topics   Alcohol use: No   Drug use: No     Allergies   Patient has no known allergies.   Review of Systems Review of Systems Per HPI  Physical Exam Triage Vital Signs ED Triage Vitals  Encounter Vitals Group     BP 12/18/22 1339 137/81     Systolic BP Percentile --      Diastolic BP Percentile --      Pulse Rate 12/18/22 1339 78     Resp 12/18/22 1339 18     Temp 12/18/22 1339 98.2  F (36.8 C)     Temp Source 12/18/22 1339 Oral     SpO2 12/18/22 1339 95 %     Weight --      Height --      Head Circumference --      Peak Flow --      Pain Score 12/18/22 1340 5     Pain Loc --      Pain Education --      Exclude from Growth Chart --    No data found.  Updated Vital Signs BP 137/81 (BP Location: Right Arm)   Pulse 78   Temp 98.2 F (36.8 C) (Oral)   Resp 18   LMP  (LMP Unknown) Comment: IUD  SpO2 95%   Visual Acuity Right Eye Distance:   Left Eye Distance:   Bilateral Distance:    Right Eye Near:   Left Eye Near:    Bilateral Near:     Physical Exam Vitals and nursing note reviewed.  Constitutional:       General: She is not in acute distress.    Appearance: Normal appearance.  HENT:     Head: Normocephalic.  Eyes:     Extraocular Movements: Extraocular movements intact.     Pupils: Pupils are equal, round, and reactive to light.  Cardiovascular:     Rate and Rhythm: Normal rate and regular rhythm.     Pulses: Normal pulses.     Heart sounds: Normal heart sounds.  Pulmonary:     Effort: Pulmonary effort is normal.     Breath sounds: Normal breath sounds.  Abdominal:     General: Bowel sounds are normal. There is no distension.     Palpations: Abdomen is soft.     Tenderness: There is no abdominal tenderness. There is right CVA tenderness. There is no left CVA tenderness, guarding or rebound.  Musculoskeletal:     Cervical back: Normal range of motion.  Lymphadenopathy:     Cervical: No cervical adenopathy.  Skin:    General: Skin is warm and dry.  Neurological:     General: No focal deficit present.     Mental Status: She is alert and oriented to person, place, and time.  Psychiatric:        Mood and Affect: Mood normal.        Behavior: Behavior normal.      UC Treatments / Results  Labs (all labs ordered are listed, but only abnormal results are displayed) Labs Reviewed  POCT URINALYSIS DIP (MANUAL ENTRY) - Abnormal; Notable for the following components:      Result Value   Blood, UA trace-intact (*)    Leukocytes, UA Small (1+) (*)    All other components within normal limits  URINE CULTURE    EKG   Radiology No results found.  Procedures Procedures (including critical care time)  Medications Ordered in UC Medications - No data to display  Initial Impression / Assessment and Plan / UC Course  I have reviewed the triage vital signs and the nursing notes.  Pertinent labs & imaging results that were available during my care of the patient were reviewed by me and considered in my medical decision making (see chart for details).  Urine culture resulted  which showed insignificant growth.  Urinalysis was repeated, which results did not differ from previous urinalysis performed 2 days ago.  Will have patient stop Keflex based on the urine culture result.  Will start patient on tamsulosin 0.4 mg  for possible kidney stone.  Will also provide patient a urine strainer to strain her urine.  Supportive care recommendations were provided and discussed with the patient to include over-the-counter analgesics, continuing to drink plenty of fluids, and straining her urine.  Patient was advised to follow-up in the emergency department if symptoms worsen, she was also advised to follow-up with her PCP for further evaluation if symptoms fail to improve.  Patient was advised she can follow-up in this clinic as needed.  Patient is in agreement with this plan of care and verbalizes understanding.  All questions were answered.  Patient stable for discharge.   Final Clinical Impressions(s) / UC Diagnoses   Final diagnoses:  Right flank pain     Discharge Instructions      Urine culture is pending.  You will be contacted if the pending test result is abnormal. Take medication as prescribed. Continue to drink plenty of fluids.  Try to drink at least 10-12 8 ounce glasses of water daily. May take over-the-counter Tylenol or ibuprofen as needed for pain, fever, general discomfort. Strain your urine to determine whether or not you have passed a stone. As discussed, if you develop worsening pain, it is recommended that you go to the emergency department for further evaluation. Schedule appointment with PCP, if symptoms persist once you are scheduled with PCP, discussed referral to urology.. Follow-up as needed.     ED Prescriptions     Medication Sig Dispense Auth. Provider   tamsulosin (FLOMAX) 0.4 MG CAPS capsule Take 1 capsule (0.4 mg total) by mouth daily after supper. 30 capsule Leath-Warren, Sadie Haber, NP      PDMP not reviewed this encounter.    Abran Cantor, NP 12/18/22 1445

## 2022-12-18 NOTE — ED Triage Notes (Signed)
Pt was seen on 11/2 for lower back/ flank pain x 2 weeks, was seen here and prescribed an antibiotic. Pt states she is taking the antibiotic an feels like the right sided flank pain is slightly worse today.

## 2022-12-19 LAB — URINE CULTURE: Culture: NO GROWTH

## 2022-12-22 ENCOUNTER — Emergency Department (HOSPITAL_COMMUNITY): Payer: Medicaid Other

## 2022-12-22 ENCOUNTER — Emergency Department (HOSPITAL_COMMUNITY)
Admission: EM | Admit: 2022-12-22 | Discharge: 2022-12-22 | Disposition: A | Discharge status: Home / Self Care | Payer: Medicaid Other | Attending: Student | Admitting: Student

## 2022-12-22 ENCOUNTER — Other Ambulatory Visit: Payer: Self-pay

## 2022-12-22 ENCOUNTER — Encounter (HOSPITAL_COMMUNITY): Payer: Self-pay

## 2022-12-22 DIAGNOSIS — Z79899 Other long term (current) drug therapy: Secondary | ICD-10-CM | POA: Insufficient documentation

## 2022-12-22 DIAGNOSIS — I10 Essential (primary) hypertension: Secondary | ICD-10-CM | POA: Diagnosis not present

## 2022-12-22 DIAGNOSIS — R109 Unspecified abdominal pain: Secondary | ICD-10-CM | POA: Insufficient documentation

## 2022-12-22 LAB — COMPREHENSIVE METABOLIC PANEL
ALT: 26 U/L (ref 0–44)
AST: 21 U/L (ref 15–41)
Albumin: 4.4 g/dL (ref 3.5–5.0)
Alkaline Phosphatase: 67 U/L (ref 38–126)
Anion gap: 9 (ref 5–15)
BUN: 17 mg/dL (ref 6–20)
CO2: 30 mmol/L (ref 22–32)
Calcium: 9.9 mg/dL (ref 8.9–10.3)
Chloride: 99 mmol/L (ref 98–111)
Creatinine, Ser: 0.72 mg/dL (ref 0.44–1.00)
GFR, Estimated: >60 mL/min (ref >60)
Glucose, Bld: 92 mg/dL (ref 70–99)
Potassium: 3.3 mmol/L — ABNORMAL LOW (ref 3.5–5.1)
Sodium: 138 mmol/L (ref 135–145)
Total Bilirubin: 0.5 mg/dL (ref <1.2)
Total Protein: 7.5 g/dL (ref 6.5–8.1)

## 2022-12-22 LAB — CBC WITH DIFFERENTIAL/PLATELET
Abs Immature Granulocytes: 0.03 10*3/uL (ref 0.00–0.07)
Basophils Absolute: 0.1 10*3/uL (ref 0.0–0.1)
Basophils Relative: 1 %
Eosinophils Absolute: 0.3 10*3/uL (ref 0.0–0.5)
Eosinophils Relative: 4 %
HCT: 40.3 % (ref 36.0–46.0)
Hemoglobin: 13.1 g/dL (ref 12.0–15.0)
Immature Granulocytes: 0 %
Lymphocytes Relative: 23 %
Lymphs Abs: 1.7 10*3/uL (ref 0.7–4.0)
MCH: 26.1 pg (ref 26.0–34.0)
MCHC: 32.5 g/dL (ref 30.0–36.0)
MCV: 80.4 fL (ref 80.0–100.0)
Monocytes Absolute: 0.6 10*3/uL (ref 0.1–1.0)
Monocytes Relative: 8 %
Neutro Abs: 4.8 10*3/uL (ref 1.7–7.7)
Neutrophils Relative %: 64 %
Platelets: 284 10*3/uL (ref 150–400)
RBC: 5.01 MIL/uL (ref 3.87–5.11)
RDW: 13.9 % (ref 11.5–15.5)
WBC: 7.5 10*3/uL (ref 4.0–10.5)
nRBC: 0 % (ref 0.0–0.2)

## 2022-12-22 LAB — URINALYSIS, ROUTINE W REFLEX MICROSCOPIC
Bilirubin Urine: NEGATIVE
Glucose, UA: NEGATIVE mg/dL
Hgb urine dipstick: NEGATIVE
Ketones, ur: NEGATIVE mg/dL
Leukocytes,Ua: NEGATIVE
Nitrite: NEGATIVE
Protein, ur: NEGATIVE mg/dL
Specific Gravity, Urine: 1.01 (ref 1.005–1.030)
pH: 6 (ref 5.0–8.0)

## 2022-12-22 LAB — LIPASE, BLOOD: Lipase: 51 U/L (ref 11–51)

## 2022-12-22 MED ORDER — ACETAMINOPHEN 500 MG PO TABS
1000.0000 mg | ORAL_TABLET | Freq: Once | ORAL | Status: DC
  Filled 2022-12-22: qty 2

## 2022-12-22 NOTE — Discharge Instructions (Signed)
Evaluation today was overall reassuring.  CT scan and x-ray were negative for acute injury.  Suspect muscle pain.  Recommend supportive treatment at home and follow-up with your PCP.  If you develop chest pain, shortness of breath, blood in the urine, painful urination or fever or any other concerning symptom please return to the emergency department for further evaluation.

## 2022-12-22 NOTE — ED Triage Notes (Signed)
RIGHT flank pain x3 weeks Seen at urgent care  Given ABX for UTI, told she has kidney stones  Pain uncontrolled Denies any urinary s/s

## 2022-12-22 NOTE — ED Provider Notes (Signed)
[facility] Provider Note   CSN: [id number] Arrival date & time: 12/22/22  1326     History  Chief Complaint  Patient presents with   Flank Pain   HPI RADIANCE DEVOTO is a 57 y.o. female with history of hyperlipidemia and hypertension presenting for right flank pain. Started 3 weeks ago but much worse in the last couple days.  Was seen at urgent care a few days ago and was diagnosed with a UTI. Urine culture at that time was taken but was negative.  Patient has been taking Keflex.  At this time patient denies urinary symptoms.  Denies nausea vomiting diarrhea.    Flank Pain       Home Medications Prior to Admission medications   Medication Sig Start Date End Date Taking? Authorizing Provider  amLODipine (NORVASC) 5 MG tablet Take 5 mg by mouth daily.    [provider]  cephALEXin (KEFLEX) 500 MG capsule Take 1 capsule (500 mg total) by mouth 2 (two) times daily. 12/16/22   Particia Nearing, PA-C  gemfibrozil (LOPID) 600 MG tablet Take 600 mg by mouth 2 (two) times daily before a meal.    [provider]  HYDROcodone-acetaminophen (NORCO/VICODIN) 5-325 MG per tablet Take 1 tablet by mouth every 4 (four) hours as needed for pain. 01/01/12   Hayden Rasmussen, NP  lisinopril-hydrochlorothiazide (PRINZIDE,ZESTORETIC) 20-12.5 MG tablet Take 2 tablets by mouth daily.    [provider]  LISINOPRIL-HYDROCHLOROTHIAZIDE PO Take by mouth 2 (two) times daily.    [provider]  metoprolol (LOPRESSOR) 100 MG tablet Take 100 mg by mouth 2 (two) times daily.    [provider]  tamsulosin (FLOMAX) 0.4 MG CAPS capsule Take 1 capsule (0.4 mg total) by mouth daily after supper. 12/18/22 01/17/23  Leath-Warren, Sadie Haber, NP      Allergies    Patient has no known allergies.    Review of Systems   Review of Systems  Genitourinary:  Positive for flank pain.    Physical Exam   Vitals:   12/22/22 1334   BP: (!) 141/73  Pulse: 76  Resp: 16  Temp: 98.9 F (37.2 C)  SpO2: 99%    CONSTITUTIONAL:  well-appearing, NAD NEURO:  Alert and oriented x 3, CN 3-12 grossly intact EYES:  eyes equal and reactive ENT/NECK:  Supple, no stridor  CARDIO:  regular rate and rhythm, appears well-perfused  PULM:  No respiratory distress, CTAB GI/GU:  non-distended, soft, non tender and no cva tenderness MSK/SPINE:  No gross deformities, no edema, moves all extremities  SKIN:  no rash, atraumatic  *Additional and/or pertinent findings included in MDM below  ED Results / Procedures / Treatments   Labs (all labs ordered are listed, but only abnormal results are displayed) Labs Reviewed  URINALYSIS, ROUTINE W REFLEX MICROSCOPIC - Abnormal; Notable for the following components:      Result Value   Color, Urine STRAW (*)    All other components within normal limits  COMPREHENSIVE METABOLIC PANEL - Abnormal; Notable for the following components:   Potassium 3.3 (*)    All other components within normal limits  CBC WITH DIFFERENTIAL/PLATELET  LIPASE, BLOOD    EKG None  Radiology DG Ribs Unilateral W/Chest Right  Result Date: 12/22/2022 CLINICAL DATA:  Flank pain EXAM: RIGHT RIBS AND CHEST - 5 VIEW COMPARISON:  X-ray 10/15/2003 FINDINGS: No consolidation, pneumothorax or effusion. No edema. Normal cardiopericardial silhouette. No right-sided rib fracture identified  on dedicated views. IMPRESSION: No right-sided rib fracture identified by x-ray. No pleural effusion or pneumothorax. Electronically Signed   By: Karen Kays M.D.   On: 12/22/2022 16:58   CT Renal Stone Study  Result Date: 12/22/2022 CLINICAL DATA:  Right flank pain for 3 weeks. EXAM: CT ABDOMEN AND PELVIS WITHOUT CONTRAST TECHNIQUE: Multidetector CT imaging of the abdomen and pelvis was performed following the standard protocol without IV contrast. RADIATION DOSE REDUCTION: This exam was performed according to the departmental  dose-optimization program which includes automated exposure control, adjustment of the mA and/or kV according to patient size and/or use of iterative reconstruction technique. COMPARISON:  None Available. FINDINGS: Lower chest: No acute abnormality. Hepatobiliary: No focal liver abnormality is seen. No gallstones, gallbladder wall thickening, or biliary dilatation. Pancreas: Unremarkable. No pancreatic ductal dilatation or surrounding inflammatory changes. Spleen: Normal in size without focal abnormality. Adrenals/Urinary Tract: Adrenal glands are unremarkable. Kidneys are normal, without renal calculi, focal lesion, or hydronephrosis. Bladder is unremarkable. Stomach/Bowel: Stomach is within normal limits. Appendix appears normal. No evidence of bowel wall thickening, distention, or inflammatory changes. Vascular/Lymphatic: Aortic atherosclerosis. No enlarged abdominal or pelvic lymph nodes. Reproductive: Uterus and bilateral adnexa are unremarkable. Other: No abdominal wall hernia or abnormality. No abdominopelvic ascites. Musculoskeletal: Bilateral L5 spondylolysis. No acute osseous abnormality is noted. IMPRESSION: Bilateral L5 spondylolysis. No acute abnormality seen in the abdomen or pelvis. Electronically Signed   By: Lupita Raider M.D.   On: 12/22/2022 16:46    Procedures Procedures    Medications Ordered in ED Medications  acetaminophen (TYLENOL) tablet 1,000 mg (has no administration in time range)    ED Course/ Medical Decision Making/ A&P                                 Medical Decision Making Amount and/or Complexity of Data Reviewed Labs: ordered. Radiology: ordered.  Risk OTC drugs.   57 year old well-appearing female presenting for flank pain.  Exam is unremarkable without flank tenderness.  DDx includes nephrolithiasis, pyelonephritis, rib fracture, pneumonia or pneumothorax.  Patient is well-appearing and nontoxic and hemodynamically stable.  CT and x-ray ordered without  acute findings.  Suspect MSK etiology.  Advised conservative treatment and follow-up with her PCP.  Discussed return precautions.  Vital stable discharged home in good condition.        Final Clinical Impression(s) / ED Diagnoses Final diagnoses:  Right flank pain    Rx / DC Orders ED Discharge Orders     None         Gareth Eagle, PA-C 12/22/22 1713    Kommor, Wyn Forster, MD 12/22/22 1719
